# Patient Record
Sex: Female | Born: 1985 | Race: White | Hispanic: No | Marital: Married | Smoking: Never smoker
Health system: Southern US, Community
[De-identification: ages and names within clinical notes are randomized; demographics above are authoritative.]

## PROBLEM LIST (undated history)

## (undated) DIAGNOSIS — R011 Cardiac murmur, unspecified: Secondary | ICD-10-CM

## (undated) DIAGNOSIS — I341 Nonrheumatic mitral (valve) prolapse: Secondary | ICD-10-CM

## (undated) HISTORY — PX: NO PAST SURGERIES: SHX2092

---

## 2015-01-09 ENCOUNTER — Ambulatory Visit: Payer: Self-pay | Admitting: Emergency Medicine

## 2015-01-14 ENCOUNTER — Ambulatory Visit: Payer: Self-pay | Admitting: Family Medicine

## 2016-09-28 ENCOUNTER — Ambulatory Visit (INDEPENDENT_AMBULATORY_CARE_PROVIDER_SITE_OTHER): Payer: BLUE CROSS/BLUE SHIELD

## 2016-09-28 ENCOUNTER — Ambulatory Visit
Admission: EM | Admit: 2016-09-28 | Discharge: 2016-09-28 | Disposition: A | Discharge status: Home / Self Care | Payer: BLUE CROSS/BLUE SHIELD | Attending: Emergency Medicine | Admitting: Emergency Medicine

## 2016-09-28 DIAGNOSIS — M62830 Muscle spasm of back: Secondary | ICD-10-CM

## 2016-09-28 DIAGNOSIS — S39012A Strain of muscle, fascia and tendon of lower back, initial encounter: Secondary | ICD-10-CM | POA: Diagnosis not present

## 2016-09-28 HISTORY — DX: Cardiac murmur, unspecified: R01.1

## 2016-09-28 HISTORY — DX: Nonrheumatic mitral (valve) prolapse: I34.1

## 2016-09-28 MED ORDER — NAPROXEN 500 MG PO TABS: 500.0000 mg | ORAL_TABLET | Freq: Two times a day (BID) | ORAL | 0 refills | Status: AC

## 2016-09-28 MED ORDER — KETOROLAC TROMETHAMINE 60 MG/2ML IM SOLN
60.0000 mg | Freq: Once | INTRAMUSCULAR | Status: AC
  Administered 2016-09-28: 60 mg via INTRAMUSCULAR

## 2016-09-28 MED ORDER — CYCLOBENZAPRINE HCL 10 MG PO TABS: 10.0000 mg | ORAL_TABLET | Freq: Two times a day (BID) | ORAL | 0 refills | Status: AC | PRN

## 2016-09-28 NOTE — ED Provider Notes (Signed)
CSN: [id number]     Arrival date & time 09/28/16  0803 History   None    Chief Complaint  Patient presents with  . Back Pain   (Consider location/radiation/quality/duration/timing/severity/associated sxs/prior Treatment) Pt is tearful in obvious discomfort states that she was leaning over to pick up something off ground on Friday and began having pain in lower back. Has had sciatica in the past and states that the symptoms do not feel the same. Denies any known injury. Pain radiates to rt hip and she has pain with external rotation of hip. Has been use motrin, ice hot patches with no change in pain. Position does not help with discomfort denies any loss of bowel or urine. Denies any urinary sx.       Past Medical History:  Diagnosis Date  . Heart murmur   . Mitral valve prolapse    Past Surgical History:  Procedure Laterality Date  . NO PAST SURGERIES     History reviewed. No pertinent family history. Social History  Substance Use Topics  . Smoking status: Never Smoker  . Smokeless tobacco: Never Used  . Alcohol use Yes     Comment: rarely   OB History    No data available     Review of Systems  Constitutional: Negative.   Respiratory: Negative.   Cardiovascular: Negative.   Gastrointestinal: Negative.   Genitourinary: Negative.   Musculoskeletal: Positive for back pain.       Pain lower back radiates to rt hip   Skin: Negative.   Neurological: Negative.     Allergies  Review of patient's allergies indicates no known allergies.  Home Medications   Prior to Admission medications   Not on File   Meds Ordered and Administered this Visit   Medications  ketorolac (TORADOL) injection 60 mg (not administered)    BP 135/89 (BP Location: Left Arm)   Pulse 89   Temp 98.4 F (36.9 C) (Tympanic)   Resp 16   Ht 5\' 6"  (1.676 m)   Wt 160 lb (72.6 kg)   LMP 08/29/2016   SpO2 100%   BMI 25.82 kg/m  No data found.   Physical Exam  Constitutional: She appears  well-developed.  Cardiovascular: Normal rate and regular rhythm.   Murmur heard. Hx mitral valve   Pulmonary/Chest: Effort normal and breath sounds normal.  Abdominal: Soft. Bowel sounds are normal.  Genitourinary:  Genitourinary Comments: Denies any cv tenderness, denies any urinary sx,   Musculoskeletal: She exhibits tenderness.       Lumbar back: She exhibits tenderness and pain.  Pain to Lumbar and sacral area tenderness on palpation, pain to external rotation of hip, minimal ROM with flexion of RT leg. Strong pulses, denies any numbness.   Skin: Skin is warm.    Urgent Care Course   Clinical Course    Procedures (including critical care time)  Labs Review Labs Reviewed - No data to display  Imaging Review No results found.       MDM   1. Strain of lumbar region, initial encounter   2. Muscle spasm of back    Dicussed treatment of back spasms and possible sciatica treatments. Medications may make you tired do not take while driving May need to f/u with ortho if no relief in 1 week Reviewed x ray with pt.  Expressed if pt has loss of bowel or bladder or sx are worse will need to go to the ER.     Tobi BastosMelanie A Jarvis Sawa, NP  09/28/16 1002  

## 2016-09-28 NOTE — ED Triage Notes (Signed)
Patient states that she is having dull with some sharp pain in her lower back. Patient states that symptoms started on Friday. Patient states that she does notices some radiation down her right leg. Patient states that pain is worse with bending and crossing right leg. Patient has had no known injury to back.

## 2016-10-01 ENCOUNTER — Telehealth: Payer: Self-pay

## 2016-10-01 NOTE — Telephone Encounter (Signed)
Courtesy call back completed today after patient's visit at [facility]. Patient improved and will call back with any questions or concerns.  

## 2017-01-10 ENCOUNTER — Encounter: Payer: Self-pay | Admitting: Gynecology

## 2017-01-10 ENCOUNTER — Ambulatory Visit
Admission: EM | Admit: 2017-01-10 | Discharge: 2017-01-10 | Disposition: A | Discharge status: Home / Self Care | Payer: Managed Care, Other (non HMO) | Attending: Family Medicine | Admitting: Family Medicine

## 2017-01-10 DIAGNOSIS — H6502 Acute serous otitis media, left ear: Secondary | ICD-10-CM

## 2017-01-10 DIAGNOSIS — J028 Acute pharyngitis due to other specified organisms: Secondary | ICD-10-CM

## 2017-01-10 LAB — RAPID STREP SCREEN (MED CTR MEBANE ONLY): Streptococcus, Group A Screen (Direct): NEGATIVE

## 2017-01-10 MED ORDER — AMOXICILLIN-POT CLAVULANATE 875-125 MG PO TABS: 1.0000 | ORAL_TABLET | Freq: Two times a day (BID) | ORAL | 0 refills | Status: AC

## 2017-01-10 MED ORDER — FEXOFENADINE-PSEUDOEPHED ER 180-240 MG PO TB24: 1.0000 | ORAL_TABLET | Freq: Every day | ORAL | 0 refills | Status: AC

## 2017-01-10 NOTE — ED Provider Notes (Signed)
[facility]    CSN: [id number] Arrival date & time: 01/10/17  4098     History   Chief Complaint Chief Complaint  Patient presents with  . sinus congestion    HPI Caitlin Mcdonald is a 31 y.o. female.   Patient is a 31 year old white female with a history of nasal congestion and cough. Everything started on Wednesday since then she's had progressive increasing sore throat and nasal congestion and some left ear discomfort. Her husband was worried that she might become down the flu is not had a fever. Her husband has strep throat a few weeks ago and so they are also concerned about whether she had strep. She's had some back pain she has history of mitral valve prolapse. She had breast cancer in a grandfather with lung cancer. She does not smoke surgeries. No known drug allergies.   The history is provided by the patient. No language interpreter was used.  Sore Throat  This is a new problem. The current episode started more than 2 days ago. The problem occurs constantly. The problem has been gradually worsening. Pertinent negatives include no chest pain, no abdominal pain, no headaches and no shortness of breath. Nothing aggravates the symptoms. Nothing relieves the symptoms. She has tried nothing for the symptoms. The treatment provided no relief.    Past Medical History:  Diagnosis Date  . Heart murmur   . Mitral valve prolapse     There are no active problems to display for this patient.   Past Surgical History:  Procedure Laterality Date  . NO PAST SURGERIES      OB History    No data available       Home Medications    Prior to Admission medications   Medication Sig Start Date End Date Taking? Authorizing Provider  amoxicillin-clavulanate (AUGMENTIN) 875-125 MG tablet Take 1 tablet by mouth 2 (two) times daily. 01/10/17   Hassan Rowan, MD  cyclobenzaprine (FLEXERIL) 10 MG tablet Take 1 tablet (10 mg total) by mouth 2 (two) times daily as needed  for muscle spasms. 09/28/16   Tobi Bastos, NP  fexofenadine-pseudoephedrine (ALLEGRA-D ALLERGY & CONGESTION) 180-240 MG 24 hr tablet Take 1 tablet by mouth daily. 01/10/17   Hassan Rowan, MD  naproxen (NAPROSYN) 500 MG tablet Take 1 tablet (500 mg total) by mouth 2 (two) times daily. 09/28/16   Tobi Bastos, NP    Family History No family history on file.  Social History Social History  Substance Use Topics  . Smoking status: Never Smoker  . Smokeless tobacco: Never Used  . Alcohol use Yes     Comment: rarely     Allergies   Patient has no known allergies.   Review of Systems Review of Systems  HENT: Positive for congestion, ear pain, rhinorrhea and sore throat.   Respiratory: Negative for shortness of breath.   Cardiovascular: Negative for chest pain.  Gastrointestinal: Negative for abdominal pain.  Neurological: Negative for headaches.  All other systems reviewed and are negative.    Physical Exam Triage Vital Signs ED Triage Vitals  Enc Vitals Group     BP 01/10/17 0826 111/81     Pulse Rate 01/10/17 0826 90     Resp 01/10/17 0826 16     Temp 01/10/17 0826 98.1 F (36.7 C)     Temp Source 01/10/17 0826 Oral     SpO2 01/10/17 0826 98 %     Weight 01/10/17 0828 162 lb (73.5 kg)  Height 01/10/17 0828 5\' 6"  (1.676 m)     Head Circumference --      Peak Flow --      Pain Score 01/10/17 0829 2     Pain Loc --      Pain Edu? --      Excl. in GC? --    No data found.   Updated Vital Signs BP 111/81 (BP Location: Left Arm)   Pulse 90   Temp 98.1 F (36.7 C) (Oral)   Resp 16   Ht 5\' 6"  (1.676 m)   Wt 162 lb (73.5 kg)   LMP 12/28/2016   SpO2 98%   BMI 26.15 kg/m   Visual Acuity Right Eye Distance:   Left Eye Distance:   Bilateral Distance:    Right Eye Near:   Left Eye Near:    Bilateral Near:     Physical Exam  Constitutional: She is oriented to person, place, and time. She appears well-developed and well-nourished.  HENT:  Head:  Normocephalic and atraumatic.  Right Ear: Hearing, tympanic membrane, external ear and ear canal normal.  Left Ear: Hearing, external ear and ear canal normal. Tympanic membrane is erythematous and bulging. Tympanic membrane is not injected.  Nose: Mucosal edema present. No rhinorrhea. Right sinus exhibits no maxillary sinus tenderness and no frontal sinus tenderness. Left sinus exhibits no maxillary sinus tenderness and no frontal sinus tenderness.  Mouth/Throat: Uvula is midline. Posterior oropharyngeal erythema present. Tonsils are 2+ on the right. Tonsils are 2+ on the left.  Eyes: Pupils are equal, round, and reactive to light.  Neck: Normal range of motion. Neck supple.  Pulmonary/Chest: Effort normal.  Musculoskeletal: Normal range of motion.  Neurological: She is alert and oriented to person, place, and time.  Skin: Skin is warm and dry. No rash noted. No erythema.  Psychiatric: She has a normal mood and affect.  Vitals reviewed.    UC Treatments / Results  Labs (all labs ordered are listed, but only abnormal results are displayed) Labs Reviewed  RAPID STREP SCREEN (NOT AT [facility])  CULTURE, GROUP A STREP [facility])    EKG  EKG Interpretation None       Radiology No results found.  Procedures Procedures (including critical care time)  Medications Ordered in UC Medications - No data to display  Results for orders placed or performed during the hospital encounter of 01/10/17  Rapid strep screen  Result Value Ref Range   Streptococcus, Group A Screen (Direct) NEGATIVE NEGATIVE   Initial Impression / Assessment and Plan / UC Course  I have reviewed the triage vital signs and the nursing notes.  Pertinent labs & imaging results that were available during my care of the patient were reviewed by me and considered in my medical decision making (see chart for details).     We'll obtain strep test if strep is positive amoxicillin for treatment if strep is negative will go  from Augmentin to treat the left ear infection  Final Clinical Impressions(s) / UC Diagnoses   Final diagnoses:  Pharyngitis due to other organism  Acute serous otitis media of left ear, recurrence not specified    New Prescriptions New Prescriptions   AMOXICILLIN-CLAVULANATE (AUGMENTIN) 875-125 MG TABLET    Take 1 tablet by mouth 2 (two) times daily.   FEXOFENADINE-PSEUDOEPHEDRINE (ALLEGRA-D ALLERGY & CONGESTION) 180-240 MG 24 HR TABLET    Take 1 tablet by mouth daily.    Note: This dictation was prepared with Dragon dictation along with smaller  Lobbyistphrase technology. Any transcriptional errors that result from this process are unintentional.    Hassan RowanEugene Dominick Zertuche, MD 01/10/17 [phone]

## 2017-01-10 NOTE — ED Triage Notes (Signed)
Patient c/o sinus congestion /sore throat x 3 days ago.

## 2017-01-13 LAB — CULTURE, GROUP A STREP (THRC)

## 2018-01-09 ENCOUNTER — Other Ambulatory Visit: Payer: Self-pay

## 2018-01-09 ENCOUNTER — Ambulatory Visit
Admission: EM | Admit: 2018-01-09 | Discharge: 2018-01-09 | Disposition: A | Discharge status: Home / Self Care | Payer: BLUE CROSS/BLUE SHIELD | Attending: Family Medicine | Admitting: Family Medicine

## 2018-01-09 ENCOUNTER — Ambulatory Visit (INDEPENDENT_AMBULATORY_CARE_PROVIDER_SITE_OTHER): Payer: BLUE CROSS/BLUE SHIELD

## 2018-01-09 DIAGNOSIS — R1012 Left upper quadrant pain: Secondary | ICD-10-CM

## 2018-01-09 DIAGNOSIS — R110 Nausea: Secondary | ICD-10-CM | POA: Diagnosis not present

## 2018-01-09 LAB — URINALYSIS, COMPLETE (UACMP) WITH MICROSCOPIC
BILIRUBIN URINE: NEGATIVE
Glucose, UA: NEGATIVE mg/dL
HGB URINE DIPSTICK: NEGATIVE
Ketones, ur: NEGATIVE mg/dL
Leukocytes, UA: NEGATIVE
NITRITE: NEGATIVE
Protein, ur: NEGATIVE mg/dL
RBC / HPF: NONE SEEN RBC/hpf (ref 0–5)
SPECIFIC GRAVITY, URINE: 1.01 (ref 1.005–1.030)
pH: 7 (ref 5.0–8.0)

## 2018-01-09 LAB — COMPREHENSIVE METABOLIC PANEL
ALK PHOS: 58 U/L (ref 38–126)
ALT: 18 U/L (ref 14–54)
ANION GAP: 9 (ref 5–15)
AST: 21 U/L (ref 15–41)
Albumin: 4.3 g/dL (ref 3.5–5.0)
BILIRUBIN TOTAL: 1.7 mg/dL — AB (ref 0.3–1.2)
BUN: 17 mg/dL (ref 6–20)
CALCIUM: 9 mg/dL (ref 8.9–10.3)
CO2: 24 mmol/L (ref 22–32)
Chloride: 105 mmol/L (ref 101–111)
Creatinine, Ser: 0.63 mg/dL (ref 0.44–1.00)
Glucose, Bld: 97 mg/dL (ref 65–99)
Potassium: 3.9 mmol/L (ref 3.5–5.1)
SODIUM: 138 mmol/L (ref 135–145)
TOTAL PROTEIN: 7.8 g/dL (ref 6.5–8.1)

## 2018-01-09 LAB — CBC WITH DIFFERENTIAL/PLATELET
Basophils Absolute: 0 10*3/uL (ref 0–0.1)
Basophils Relative: 1 %
EOS ABS: 0.1 10*3/uL (ref 0–0.7)
EOS PCT: 1 %
HCT: 45.3 % (ref 35.0–47.0)
HEMOGLOBIN: 15.8 g/dL (ref 12.0–16.0)
LYMPHS ABS: 2.1 10*3/uL (ref 1.0–3.6)
Lymphocytes Relative: 36 %
MCH: 33.3 pg (ref 26.0–34.0)
MCHC: 34.9 g/dL (ref 32.0–36.0)
MCV: 95.3 fL (ref 80.0–100.0)
Monocytes Absolute: 0.4 10*3/uL (ref 0.2–0.9)
Monocytes Relative: 6 %
NEUTROS PCT: 56 %
Neutro Abs: 3.2 10*3/uL (ref 1.4–6.5)
Platelets: 245 10*3/uL (ref 150–440)
RBC: 4.76 MIL/uL (ref 3.80–5.20)
RDW: 12.6 % (ref 11.5–14.5)
WBC: 5.8 10*3/uL (ref 3.6–11.0)

## 2018-01-09 LAB — LIPASE, BLOOD: Lipase: 29 U/L (ref 11–51)

## 2018-01-09 MED ORDER — CEPHALEXIN 500 MG PO CAPS: 500.0000 mg | ORAL_CAPSULE | Freq: Two times a day (BID) | ORAL | 0 refills | Status: AC

## 2018-01-09 NOTE — Discharge Instructions (Signed)
Over the counter Prilosec (omeprazole) Increase fluids

## 2018-01-09 NOTE — ED Provider Notes (Signed)
[facility]    CSN: [id number] Arrival date & time: 01/09/18  0807     History   Chief Complaint Chief Complaint  Patient presents with  . Abdominal Pain    HPI Caitlin Mcdonald is a 32 y.o. female.   The history is provided by the patient.  Abdominal Pain  Pain location:  LUQ Pain quality: sharp and stabbing   Pain radiates to:  Epigastric region Pain severity:  Moderate Onset quality:  Sudden Timing:  Intermittent Progression:  Unchanged Chronicity:  New Context: eating (makes it worse)   Context: not alcohol use, not awakening from sleep, not diet changes, not laxative use, not medication withdrawal, not previous surgeries, not recent illness, not recent sexual activity, not recent travel, not retching, not sick contacts, not suspicious food intake and not trauma   Relieved by:  None tried Ineffective treatments:  None tried Associated symptoms: nausea   Associated symptoms: no anorexia, no belching, no chest pain, no chills, no constipation, no cough, no diarrhea, no dysuria, no fatigue, no fever, no flatus, no hematemesis, no hematochezia, no hematuria, no melena, no shortness of breath, no sore throat, no vaginal bleeding, no vaginal discharge and no vomiting   Risk factors: no alcohol abuse, no aspirin use, not elderly, has not had multiple surgeries, no NSAID use, not obese, not pregnant and no recent hospitalization     Past Medical History:  Diagnosis Date  . Heart murmur   . Mitral valve prolapse     There are no active problems to display for this patient.   Past Surgical History:  Procedure Laterality Date  . NO PAST SURGERIES      OB History    No data available       Home Medications    Prior to Admission medications   Medication Sig Start Date End Date Taking? Authorizing Provider  amoxicillin-clavulanate (AUGMENTIN) 875-125 MG tablet Take 1 tablet by mouth 2 (two) times daily. 01/10/17   Hassan RowanWade, Eugene, MD  cephALEXin  (KEFLEX) 500 MG capsule Take 1 capsule (500 mg total) by mouth 2 (two) times daily. 01/09/18   Payton Mccallumonty, Keighan Amezcua, MD  cyclobenzaprine (FLEXERIL) 10 MG tablet Take 1 tablet (10 mg total) by mouth 2 (two) times daily as needed for muscle spasms. 09/28/16   Coralyn MarkMitchell, Melanie L, NP  fexofenadine-pseudoephedrine (ALLEGRA-D ALLERGY & CONGESTION) 180-240 MG 24 hr tablet Take 1 tablet by mouth daily. 01/10/17   Hassan RowanWade, Eugene, MD  naproxen (NAPROSYN) 500 MG tablet Take 1 tablet (500 mg total) by mouth 2 (two) times daily. 09/28/16   Coralyn MarkMitchell, Melanie L, NP    Family History Family History  Problem Relation Age of Onset  . Healthy Mother   . Healthy Father     Social History Social History   Tobacco Use  . Smoking status: Never Smoker  . Smokeless tobacco: Never Used  Substance Use Topics  . Alcohol use: Yes    Comment: rarely  . Drug use: No     Allergies   Patient has no known allergies.   Review of Systems Review of Systems  Constitutional: Negative for chills, fatigue and fever.  HENT: Negative for sore throat.   Respiratory: Negative for cough and shortness of breath.   Cardiovascular: Negative for chest pain.  Gastrointestinal: Positive for abdominal pain and nausea. Negative for anorexia, constipation, diarrhea, flatus, hematemesis, hematochezia, melena and vomiting.  Genitourinary: Negative for dysuria, hematuria, vaginal bleeding and vaginal discharge.     Physical Exam Triage  Vital Signs ED Triage Vitals [01/09/18 0816]  Enc Vitals Group     BP (!) 158/92     Pulse Rate 100     Resp 16     Temp 98.2 F (36.8 C)     Temp Source Oral     SpO2 100 %     Weight 163 lb (73.9 kg)     Height 5\' 6"  (1.676 m)     Head Circumference      Peak Flow      Pain Score 5     Pain Loc      Pain Edu?      Excl. in GC?    No data found.  Updated Vital Signs BP (!) 158/92 (BP Location: Left Arm)   Pulse 100   Temp 98.2 F (36.8 C) (Oral)   Resp 16   Ht 5\' 6"  (1.676 m)   Wt  163 lb (73.9 kg)   LMP 01/03/2018   SpO2 100%   BMI 26.31 kg/m   Visual Acuity Right Eye Distance:   Left Eye Distance:   Bilateral Distance:    Right Eye Near:   Left Eye Near:    Bilateral Near:     Physical Exam  Constitutional: She appears well-developed and well-nourished. No distress.  Cardiovascular: Normal rate, regular rhythm and normal heart sounds.  Pulmonary/Chest: Effort normal and breath sounds normal. No stridor. No respiratory distress. She has no wheezes.  Abdominal: Soft. Bowel sounds are normal. She exhibits no distension and no mass. There is no tenderness. There is no rebound and no guarding.  Skin: She is not diaphoretic.  Nursing note and vitals reviewed.    UC Treatments / Results  Labs (all labs ordered are listed, but only abnormal results are displayed) Labs Reviewed  URINALYSIS, COMPLETE (UACMP) WITH MICROSCOPIC - Abnormal; Notable for the following components:      Result Value   Color, Urine STRAW (*)    Squamous Epithelial / LPF 0-5 (*)    Bacteria, UA FEW (*)    All other components within normal limits  COMPREHENSIVE METABOLIC PANEL - Abnormal; Notable for the following components:   Total Bilirubin 1.7 (*)    All other components within normal limits  URINE CULTURE  CBC WITH DIFFERENTIAL/PLATELET  LIPASE, BLOOD    EKG  EKG Interpretation None       Radiology Dg Abd 2 Views  Result Date: 01/09/2018 CLINICAL DATA:  Left upper quadrant pain EXAM: ABDOMEN - 2 VIEW COMPARISON:  None. FINDINGS: Moderate stool burden There is normal bowel gas pattern. No free air. No organomegaly or suspicious calcification. No acute bony abnormality. IMPRESSION: Moderate stool burden.  No acute findings. Electronically Signed   By: Charlett Nose M.D.   On: 01/09/2018 09:32    Procedures Procedures (including critical care time)  Medications Ordered in UC Medications - No data to display   Initial Impression / Assessment and Plan / UC Course  I  have reviewed the triage vital signs and the nursing notes.  Pertinent labs & imaging results that were available during my care of the patient were reviewed by me and considered in my medical decision making (see chart for details).       Final Clinical Impressions(s) / UC Diagnoses   Final diagnoses:  Left upper quadrant pain    ED Discharge Orders        Ordered    cephALEXin (KEFLEX) 500 MG capsule  2 times daily  01/09/18 1001     1. Labs/x-ray results (normal/negative) and diagnosis reviewed with patient 2. Recommend supportive treatment with increased fluids, otc prilosec 3. Check urine culture 4. Follow-up prn if symptoms worsen or don't improve  Controlled Substance Prescriptions [place] Controlled Substance Registry consulted? Not Applicable   Payton Mccallum, MD 01/09/18 1009

## 2018-01-09 NOTE — ED Triage Notes (Signed)
Pt with LUQ abdominal pain x one week, feels sharp and constant. "Feels like a stomach pain that never goes away."  Just recovered a URI. Pain currently 5/10. No nausea unless pain gets stronger. No diarrhea.

## 2018-01-11 ENCOUNTER — Telehealth: Payer: Self-pay

## 2018-01-11 LAB — URINE CULTURE
Culture: NO GROWTH
Special Requests: NORMAL

## 2018-01-11 NOTE — Telephone Encounter (Signed)
Called to follow up with patient since visit here at [facility]. Spoke with pt. Pt. Reports improvement. Patient instructed to call back with any questions or concerns. MAH  

## 2018-09-06 IMAGING — CR DG ABDOMEN 2V
2 series · 2 of 2 positions shown · non-contrast
Comparison: None.

CLINICAL DATA: Left upper quadrant pain

EXAM:
ABDOMEN - 2 VIEW

[abdomen erect]
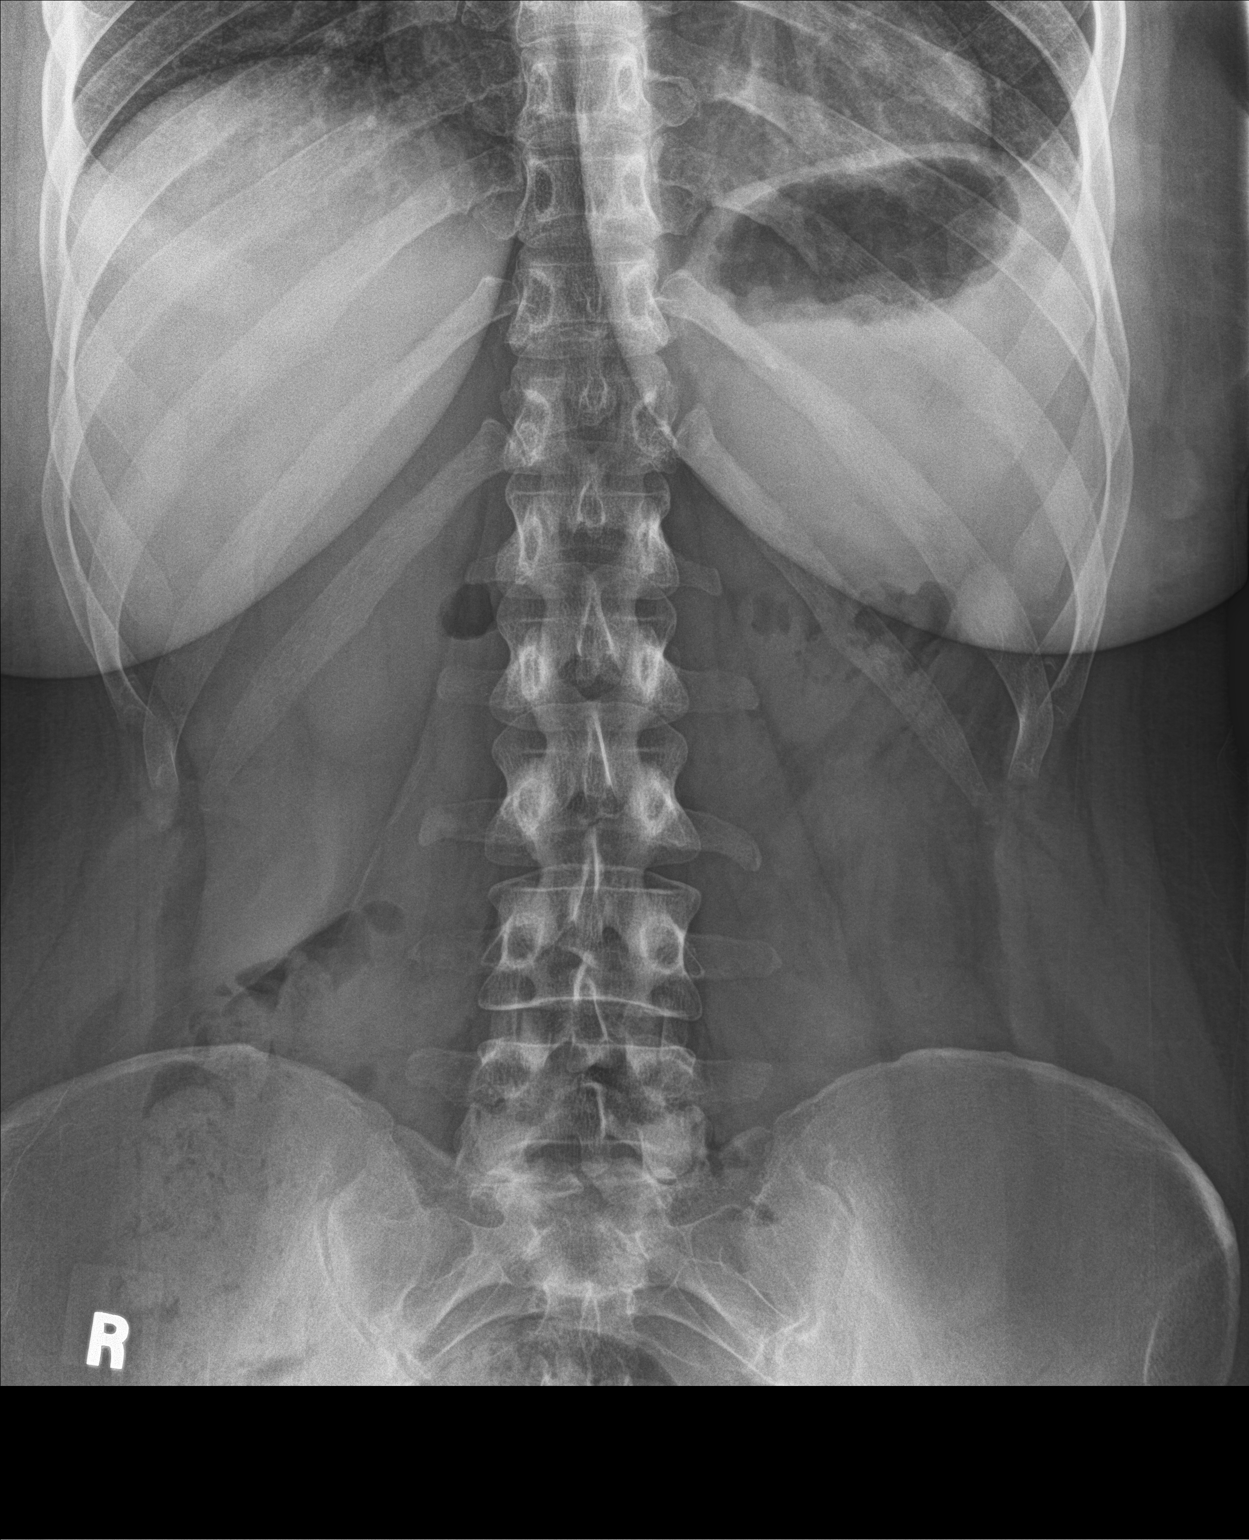

[abdomen supine]
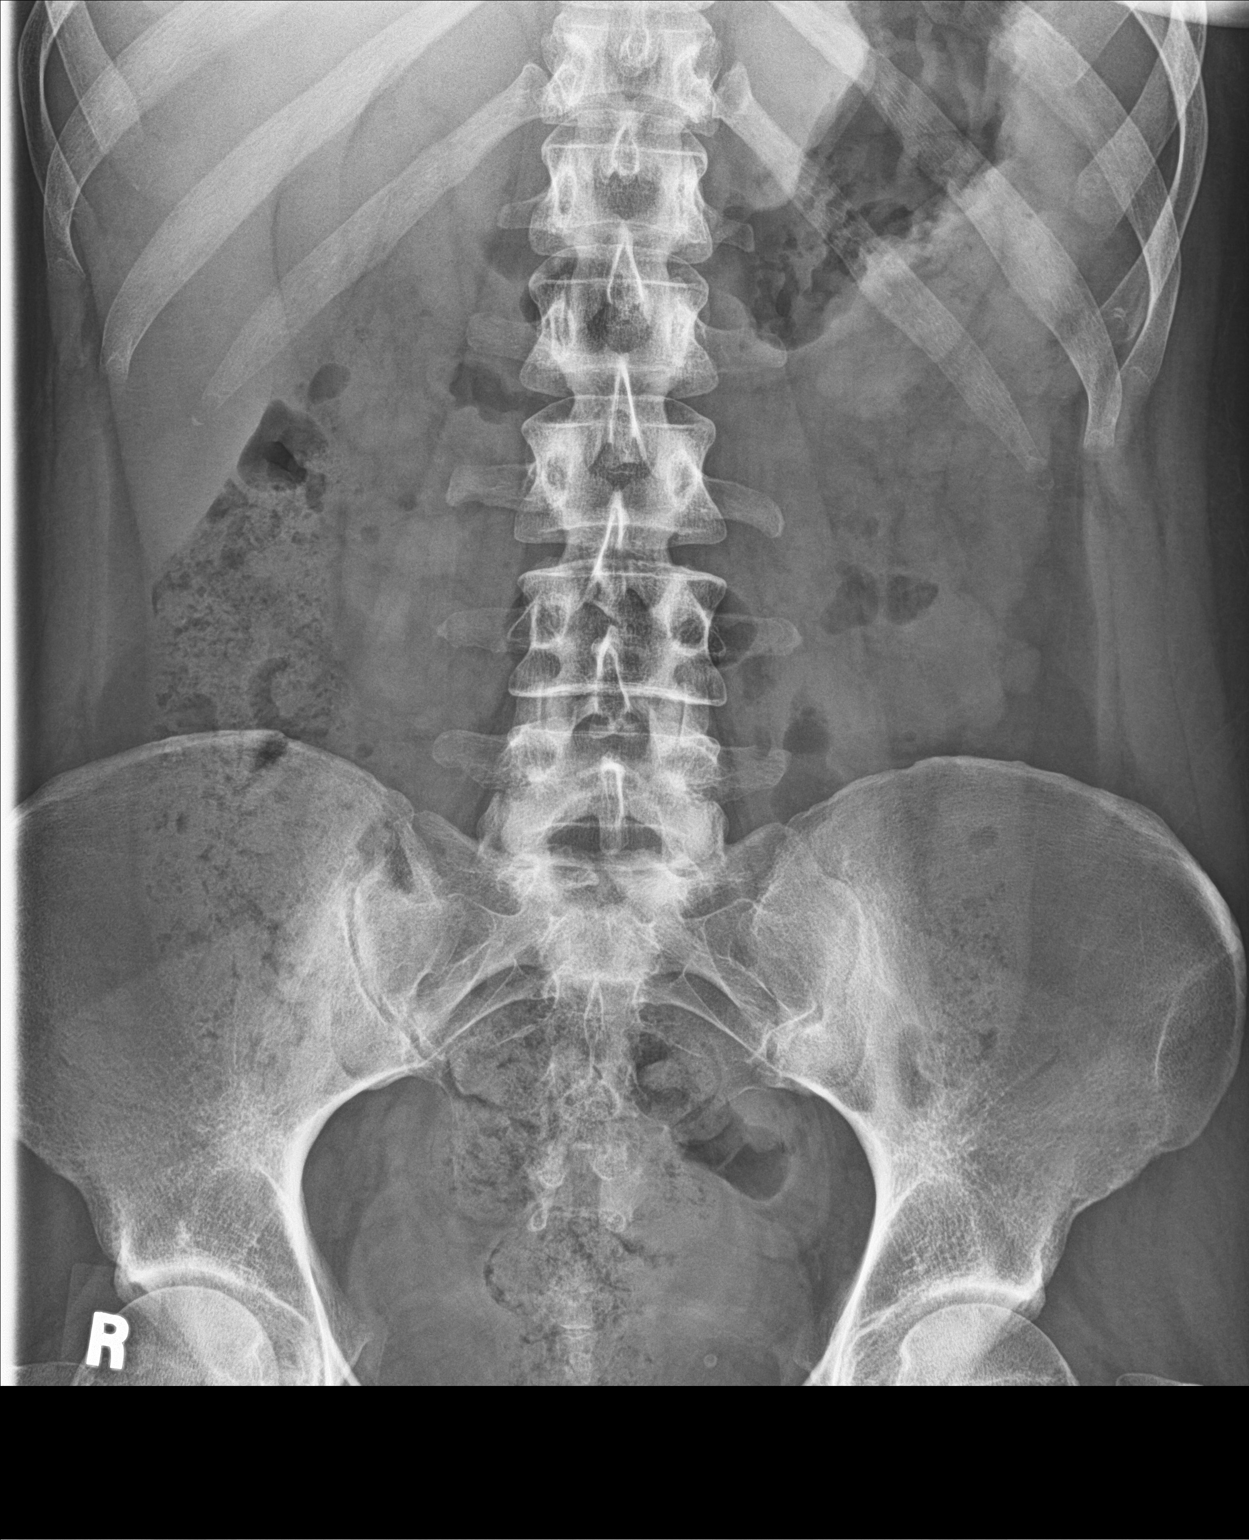

[2 of 2 positions shown; findings below may reference images not displayed]

FINDINGS: Moderate stool burden There is normal bowel gas pattern. No free
air. No organomegaly or suspicious calcification. No acute bony
abnormality.
IMPRESSION: Moderate stool burden.  No acute findings.
